# Patient Record
Sex: Female | Born: 2005 | Race: Black or African American | Hispanic: No | Marital: Single | Smoking: Never smoker
Health system: Southern US, Community
[De-identification: ages and names within clinical notes are randomized; demographics above are authoritative.]

## PROBLEM LIST (undated history)

## (undated) DIAGNOSIS — D509 Iron deficiency anemia, unspecified: Secondary | ICD-10-CM

---
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
## 2023-07-07 ENCOUNTER — Inpatient Hospital Stay: Payer: BC Managed Care – PPO

## 2023-07-07 ENCOUNTER — Inpatient Hospital Stay: Payer: BC Managed Care – PPO | Attending: Nurse Practitioner | Admitting: Nurse Practitioner

## 2023-07-07 ENCOUNTER — Encounter: Payer: Self-pay | Admitting: Nurse Practitioner

## 2023-07-07 VITALS — BP 100/72 | HR 74 | Temp 98.1°F | Resp 17 | Ht 67.5 in | Wt 120.2 lb

## 2023-07-07 DIAGNOSIS — N920 Excessive and frequent menstruation with regular cycle: Secondary | ICD-10-CM | POA: Diagnosis not present

## 2023-07-07 DIAGNOSIS — R7402 Elevation of levels of lactic acid dehydrogenase (LDH): Secondary | ICD-10-CM | POA: Insufficient documentation

## 2023-07-07 DIAGNOSIS — D649 Anemia, unspecified: Secondary | ICD-10-CM | POA: Diagnosis present

## 2023-07-07 DIAGNOSIS — R0602 Shortness of breath: Secondary | ICD-10-CM | POA: Diagnosis not present

## 2023-07-07 DIAGNOSIS — R5383 Other fatigue: Secondary | ICD-10-CM | POA: Diagnosis not present

## 2023-07-07 DIAGNOSIS — D500 Iron deficiency anemia secondary to blood loss (chronic): Secondary | ICD-10-CM

## 2023-07-07 LAB — CBC WITH DIFFERENTIAL/PLATELET
Abs Immature Granulocytes: 0 10*3/uL (ref 0.00–0.07)
Basophils Absolute: 0 10*3/uL (ref 0.0–0.1)
Basophils Relative: 0 %
Eosinophils Absolute: 0 10*3/uL (ref 0.0–0.5)
Eosinophils Relative: 1 %
HCT: 29.8 % — ABNORMAL LOW (ref 36.0–46.0)
Hemoglobin: 9 g/dL — ABNORMAL LOW (ref 12.0–15.0)
Immature Granulocytes: 0 %
Lymphocytes Relative: 35 %
Lymphs Abs: 1.1 10*3/uL (ref 0.7–4.0)
MCH: 20.6 pg — ABNORMAL LOW (ref 26.0–34.0)
MCHC: 30.2 g/dL (ref 30.0–36.0)
MCV: 68.2 fL — ABNORMAL LOW (ref 80.0–100.0)
Monocytes Absolute: 0.3 10*3/uL (ref 0.1–1.0)
Monocytes Relative: 11 %
Neutro Abs: 1.7 10*3/uL (ref 1.7–7.7)
Neutrophils Relative %: 53 %
Platelets: 273 10*3/uL (ref 150–400)
RBC: 4.37 MIL/uL (ref 3.87–5.11)
RDW: 17.6 % — ABNORMAL HIGH (ref 11.5–15.5)
Smear Review: NORMAL
WBC: 3.1 10*3/uL — ABNORMAL LOW (ref 4.0–10.5)
nRBC: 0 % (ref 0.0–0.2)

## 2023-07-07 LAB — RETIC PANEL
Immature Retic Fract: 24.6 % — ABNORMAL HIGH (ref 2.3–15.9)
RBC.: 4.39 MIL/uL (ref 3.87–5.11)
Retic Count, Absolute: 75.5 10*3/uL (ref 19.0–186.0)
Retic Ct Pct: 1.7 % (ref 0.4–3.1)
Reticulocyte Hemoglobin: 21.3 pg — ABNORMAL LOW (ref >27.9)

## 2023-07-07 LAB — IRON AND IRON BINDING CAPACITY (CC-WL,HP ONLY)
Iron: 16 ug/dL — ABNORMAL LOW (ref 28–170)
Saturation Ratios: 3 % — ABNORMAL LOW (ref 10.4–31.8)
TIBC: 536 ug/dL — ABNORMAL HIGH (ref 250–450)
UIBC: 520 ug/dL — ABNORMAL HIGH (ref 148–442)

## 2023-07-07 LAB — FOLATE: Folate: 10.8 ng/mL (ref >5.9)

## 2023-07-07 LAB — COMPREHENSIVE METABOLIC PANEL
ALT: 10 U/L (ref 0–44)
AST: 20 U/L (ref 15–41)
Albumin: 4.8 g/dL (ref 3.5–5.0)
Alkaline Phosphatase: 34 U/L — ABNORMAL LOW (ref 38–126)
Anion gap: 6 (ref 5–15)
BUN: 11 mg/dL (ref 6–20)
CO2: 24 mmol/L (ref 22–32)
Calcium: 9.5 mg/dL (ref 8.9–10.3)
Chloride: 106 mmol/L (ref 98–111)
Creatinine, Ser: 0.71 mg/dL (ref 0.44–1.00)
GFR, Estimated: >60 mL/min (ref >60)
Glucose, Bld: 90 mg/dL (ref 70–99)
Potassium: 4.5 mmol/L (ref 3.5–5.1)
Sodium: 136 mmol/L (ref 135–145)
Total Bilirubin: 1.7 mg/dL — ABNORMAL HIGH (ref 0.3–1.2)
Total Protein: 7.8 g/dL (ref 6.5–8.1)

## 2023-07-07 LAB — SAMPLE TO BLOOD BANK

## 2023-07-07 LAB — LACTATE DEHYDROGENASE: LDH: 241 U/L — ABNORMAL HIGH (ref 98–192)

## 2023-07-07 LAB — PREGNANCY, URINE: Preg Test, Ur: NEGATIVE

## 2023-07-07 LAB — TSH: TSH: 0.943 u[IU]/mL (ref 0.350–4.500)

## 2023-07-07 LAB — FERRITIN: Ferritin: 3 ng/mL — ABNORMAL LOW (ref 11–307)

## 2023-07-07 LAB — VITAMIN B12: Vitamin B-12: 349 pg/mL (ref 180–914)

## 2023-07-07 NOTE — Progress Notes (Addendum)
[facility] A Department of the [place]. [facility] [place], [place]  Clinic Day:  07/07/2023  Referring physician: Jaymes Graff, MD, [facility]  Chief Complaints: Anemia  History of Presenting Illness: Kirsten Clayton 17 y.o. female is here because of anemia. She has had history of anemia since approximately age 56. I reviewed labs available and first note anemia in April 2022 with hemoglobin 9.1 with associated microcytosis. She says it has been persistent since onset. She reports fatigue, shortness of breath with exertion and syncopal and pre-syncopal events due to her anemia. She was previously evaluated by [facility] (09/2022) for anemia (hemoglobin 6.3), Ferritin 4, Iron sat 4%, TIBC 543. She received blood transfusion. Workup including DIC, UA, TSH, Retic, LDH, Haptoglobin, B12, PT/INR, MMA, Folate were either normal or unremarkable. CRP and ESR were elevated.   Most recently, she experienced 2 syncopal episodes in 2 days after outdoor band practice. She's been increasing her fluid and food intake but symptoms persist. Started menses around age 66 which is approximately when she was first told she was anemic. She has been taking oral iron but continues to feel poorly. She complains of chest pressure on exertion, shortness of breath with exertion, pre-syncopal and syncopal episodes, pica (ice), leg cramps, brain fog and generalized fatigue. She denies NSAID ingestion and is not on antiplatelet medications. She denies black or bloody stools. She is seeing gynecology for management of her heavy periods and has started depo but continues to have daily bleeding and spotting. Overall periods have lightened though. Denies history of kidney disease, autoimmune disease, hemolytic anemia. Her brother is positive for sickle cell but she had testing and birth and was reportedly negative. She does not have a prior history or diagnosis of cancer and she is  reportedly otherwise healthy. She does not donate blood. Denies hematuria. Denies hemoptysis.   Review of Systems  Constitutional:  Positive for fatigue. Negative for appetite change and unexpected weight change.  HENT:   Negative for mouth sores, sore throat and trouble swallowing.   Respiratory:  Positive for shortness of breath. Negative for chest tightness and hemoptysis.   Cardiovascular:  Negative for chest pain and leg swelling.  Gastrointestinal:  Negative for abdominal pain, blood in stool, constipation, diarrhea, nausea, rectal pain and vomiting.  Genitourinary:  Positive for vaginal bleeding. Negative for bladder incontinence, dysuria, hematuria and pelvic pain.   Musculoskeletal:  Negative for flank pain and neck stiffness.  Skin:  Negative for itching, rash and wound.  Neurological:  Positive for light-headedness. Negative for dizziness, headaches and numbness.  Hematological:  Negative for adenopathy. Does not bruise/bleed easily.  Psychiatric/Behavioral:  Negative for confusion, depression and sleep disturbance. The patient is not nervous/anxious.      Past Medical History:  Diagnosis Date   Iron deficiency anemia    No past surgical history on file.  Family History  Problem Relation Age of Onset   Healthy Mother    Healthy Father    Sickle cell anemia Brother    Clotting disorder Maternal Grandfather     reports that she has never smoked. She has been exposed to tobacco smoke. She has never used smokeless tobacco. She reports that she does not drink alcohol and does not use drugs. She is currently in school at [employer] and plans to transfer to [employer] next year. 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   No Known Allergies  Current Outpatient Medications  Medication Sig Dispense Refill   ferrous sulfate 325 (65 FE) MG tablet Take 325 mg by mouth daily with breakfast.     ondansetron (ZOFRAN ODT) 4 MG disintegrating tablet Take 1 tablet (4 mg total) by  mouth every 8 (eight) hours as needed for nausea or vomiting. 20 tablet 0   oseltamivir (TAMIFLU) 75 MG capsule Take 1 capsule (75 mg total) by mouth every 12 (twelve) hours. 10 capsule 0   No current facility-administered medications for this visit.   Objective:  Blood pressure 100/72, pulse 74, temperature 98.1 F (36.7 C), temperature source Oral, resp. rate 17, height 5' 7.5" (1.715 m), weight 120 lb 3.2 oz (54.5 kg), SpO2 100%.  Wt Readings from Last 3 Encounters:  02/01/22 120 lb (54.4 kg) (45%, Z= -0.13)*  09/28/21 121 lb 9.6 oz (55.2 kg) (50%, Z= 0.00)*  08/09/21 118 lb 9.6 oz (53.8 kg) (44%, Z= -0.14)*   * Growth percentiles are based on CDC (Girls, 2-20 Years) data.    There is no height or weight on file to calculate BMI.  Performance status (ECOG): 1 - Symptomatic but completely ambulatory Physical Exam Vitals reviewed.  Constitutional:      Appearance: She is not ill-appearing.  Cardiovascular:     Rate and Rhythm: Normal rate and regular rhythm.  Pulmonary:     Effort: No respiratory distress.  Abdominal:     General: There is no distension.     Palpations: Abdomen is soft.     Tenderness: There is no abdominal tenderness. There is no guarding.  Neurological:     Mental Status: She is alert and oriented to person, place, and time.  Psychiatric:        Mood and Affect: Mood normal.        Behavior: Behavior normal.       Latest Ref Rng & Units 07/07/2023   10:14 AM 09/30/2022   11:50 AM 02/01/2022    7:27 PM  CBC  WBC 4.0 - 10.5 K/uL 3.1  2.8  8.0   Hemoglobin 12.0 - 15.0 g/dL 9.0  7.2  9.0   Hematocrit 36.0 - 46.0 % 29.8  25.5  30.6   Platelets 150 - 400 K/uL 273  99  629       Latest Ref Rng & Units 07/07/2023   10:14 AM 09/30/2022   11:50 AM 02/01/2022    7:27 PM  CMP  Glucose 70 - 99 mg/dL 90  87  84   BUN 6 - 20 mg/dL 11  10  11    Creatinine 0.44 - 1.00 mg/dL 6.27  0.35  0.09   Sodium 135 - 145 mmol/L 136  135  139   Potassium 3.5 - 5.1 mmol/L 4.5   3.5  3.6   Chloride 98 - 111 mmol/L 106  102  104   CO2 22 - 32 mmol/L 24  22  24    Calcium 8.9 - 10.3 mg/dL 9.5  38.1  9.9   Total Protein 6.5 - 8.1 g/dL 7.8   8.7   Total Bilirubin 0.3 - 1.2 mg/dL 1.7   1.4   Alkaline Phos 38 - 126 U/L 34   48   AST 15 - 41 U/L 20   19   ALT 0 - 44 U/L 10   10    Lab Results  Component Value Date   TIBC 536 (H) 07/07/2023   FERRITIN 3 (L) 07/07/2023   IRONPCTSAT 3 (L) 07/07/2023   Lab Results  Component Value Date   LDH 241 (H) 07/07/2023   No results found.   Assessment & Plan:  AZAYLIA SKENANDORE is a 17 y.o. female who presents in consultation of   Anemia- Anemia: today we reviewed possible etiologies of anemia including possibly multifactorial with possible causes including chronic blood loss, hyper/hypothyroidism, nutritional deficiency, infection/chronic inflammation, hemolysis, underlying bone marrow disorders. High suspicion for iron deficiency based on her history and menorrhagia. Will plan for labs today:  CBC w differential, CMP, vitamin B12, Folate, iron/TIBC, ferritin, reticulocytes, fecal occult, blood smear, TSH,  LDH, haptoglobin. Ferritin 3, iron sat 3% consistent with iron deficiency. Hemoglobin decreased at 9 but no role for transfusion. Suspect etiology of her iron deficiency is menorrhagia. Discussed iron deficiency and options for management including oral vs iv iron. She has not had durable response with oral iron, therefore, I recommend IV iron infusions. Recommend venofer 200 mg IV x 5 which she can take 1-2 times a week. I provided patient with note for school to modify her activities due to her symptoms.  Menorrhagia- patient is under the care of gyn. REviewed that she will likely need ongoing iron replacement due to heavy periods.   Disposition:  Labs today Venofer 200 mg IV x 5  1 month lab (cbc, cmp, ldh, ferritin, iron studies) Virtual follow up with me a few days later- la  Patient would like to transition care to  [employer]  I discussed the assessment and treatment plan with the patient.  The patient was provided an opportunity to ask questions and all were answered.  The patient agreed with the plan and demonstrated an understanding of the instructions.  The patient was advised to call back if the symptoms worsen or if the condition fails to improve as anticipated.  Consuello Masse, DNP, AGNP-C [facility] A Department of the [place]. [facility] [phone] (clinic)  Dr. Normand Sloop, please see my above note from today's visit. Thank you for the opportunity to participate in the care of your very pleasant patient. If you have any questions or concerns, please feel free to reach out.   CC: Dr. Normand Sloop

## 2023-07-09 ENCOUNTER — Encounter: Payer: Self-pay | Admitting: Nurse Practitioner

## 2023-07-09 LAB — HAPTOGLOBIN: Haptoglobin: 21 mg/dL — ABNORMAL LOW (ref 33–278)

## 2023-07-09 NOTE — Addendum Note (Signed)
Addended by: Alinda Dooms on: 07/09/2023 02:05 PM   Modules accepted: Orders

## 2023-07-11 LAB — HGB FRACTIONATION CASCADE
Hgb A2: 2 % (ref 1.8–3.2)
Hgb A: 98 % (ref 96.4–98.8)
Hgb F: 0 % (ref 0.0–2.0)
Hgb S: 0 %

## 2023-07-12 ENCOUNTER — Other Ambulatory Visit: Payer: Self-pay | Admitting: Nurse Practitioner

## 2023-07-12 ENCOUNTER — Ambulatory Visit (INDEPENDENT_AMBULATORY_CARE_PROVIDER_SITE_OTHER): Payer: BC Managed Care – PPO

## 2023-07-12 VITALS — BP 102/67 | HR 86 | Temp 98.9°F | Resp 16 | Ht 67.0 in | Wt 119.8 lb

## 2023-07-12 DIAGNOSIS — D649 Anemia, unspecified: Secondary | ICD-10-CM

## 2023-07-12 MED ORDER — DIPHENHYDRAMINE HCL 25 MG PO CAPS
25.0000 mg | ORAL_CAPSULE | Freq: Once | ORAL | Status: AC
  Administered 2023-07-12: 25 mg via ORAL
  Filled 2023-07-12: qty 1

## 2023-07-12 MED ORDER — ACETAMINOPHEN 325 MG PO TABS
650.0000 mg | ORAL_TABLET | Freq: Once | ORAL | Status: AC
  Administered 2023-07-12: 650 mg via ORAL
  Filled 2023-07-12: qty 2

## 2023-07-12 MED ORDER — SODIUM CHLORIDE 0.9 % IV SOLN
200.0000 mg | Freq: Once | INTRAVENOUS | Status: AC
  Administered 2023-07-12: 200 mg via INTRAVENOUS
  Filled 2023-07-12: qty 10

## 2023-07-12 NOTE — Patient Instructions (Signed)
 Iron Sucrose Injection What is this medication? IRON SUCROSE (EYE ern SOO krose) treats low levels of iron (iron deficiency anemia) in people with kidney disease. Iron is a mineral that plays an important role in making red blood cells, which carry oxygen from your lungs to the rest of your body. This medicine may be used for other purposes; ask your health care provider or pharmacist if you have questions. COMMON BRAND NAME(S): Venofer What should I tell my care team before I take this medication? They need to know if you have any of these conditions: Anemia not caused by low iron levels Heart disease High levels of iron in the blood Kidney disease Liver disease An unusual or allergic reaction to iron, other medications, foods, dyes, or preservatives Pregnant or trying to get pregnant Breastfeeding How should I use this medication? This medication is for infusion into a vein. It is given in a hospital or clinic setting. Talk to your care team about the use of this medication in children. While this medication may be prescribed for children as young as 2 years for selected conditions, precautions do apply. Overdosage: If you think you have taken too much of this medicine contact a poison control center or emergency room at once. NOTE: This medicine is only for you. Do not share this medicine with others. What if I miss a dose? Keep appointments for follow-up doses. It is important not to miss your dose. Call your care team if you are unable to keep an appointment. What may interact with this medication? Do not take this medication with any of the following: Deferoxamine Dimercaprol Other iron products This medication may also interact with the following: Chloramphenicol Deferasirox This list may not describe all possible interactions. Give your health care provider a list of all the medicines, herbs, non-prescription drugs, or dietary supplements you use. Also tell them if you smoke,  drink alcohol, or use illegal drugs. Some items may interact with your medicine. What should I watch for while using this medication? Visit your care team regularly. Tell your care team if your symptoms do not start to get better or if they get worse. You may need blood work done while you are taking this medication. You may need to follow a special diet. Talk to your care team. Foods that contain iron include: whole grains/cereals, dried fruits, beans, or peas, leafy green vegetables, and organ meats (liver, kidney). What side effects may I notice from receiving this medication? Side effects that you should report to your care team as soon as possible: Allergic reactions--skin rash, itching, hives, swelling of the face, lips, tongue, or throat Low blood pressure--dizziness, feeling faint or lightheaded, blurry vision Shortness of breath Side effects that usually do not require medical attention (report to your care team if they continue or are bothersome): Flushing Headache Joint pain Muscle pain Nausea Pain, redness, or irritation at injection site This list may not describe all possible side effects. Call your doctor for medical advice about side effects. You may report side effects to FDA at 1-800-FDA-1088. Where should I keep my medication? This medication is given in a hospital or clinic. It will not be stored at home. NOTE: This sheet is a summary. It may not cover all possible information. If you have questions about this medicine, talk to your doctor, pharmacist, or health care provider.  2024 Elsevier/Gold Standard (2023-04-20 00:00:00)

## 2023-07-12 NOTE — Progress Notes (Signed)
Diagnosis: Iron Deficiency Anemia  Provider:  Chilton Greathouse MD  Procedure: IV Infusion  IV Type: Peripheral, IV Location: R Hand  Venofer (Iron Sucrose), Dose: 200 mg  Infusion Start Time: 1337  Infusion Stop Time: 1354  Post Infusion IV Care: Observation period completed and Peripheral IV Discontinued  Discharge: Condition: Good, Destination: Home . AVS Provided  Performed by:  Garnette Czech, RN

## 2023-07-17 ENCOUNTER — Ambulatory Visit (INDEPENDENT_AMBULATORY_CARE_PROVIDER_SITE_OTHER): Payer: BC Managed Care – PPO

## 2023-07-17 VITALS — BP 105/70 | HR 60 | Temp 98.5°F | Resp 18 | Ht 67.0 in | Wt 119.0 lb

## 2023-07-17 DIAGNOSIS — D649 Anemia, unspecified: Secondary | ICD-10-CM | POA: Diagnosis not present

## 2023-07-17 MED ORDER — DIPHENHYDRAMINE HCL 25 MG PO CAPS
25.0000 mg | ORAL_CAPSULE | Freq: Once | ORAL | Status: AC
  Administered 2023-07-17: 25 mg via ORAL
  Filled 2023-07-17: qty 1

## 2023-07-17 MED ORDER — ACETAMINOPHEN 325 MG PO TABS
650.0000 mg | ORAL_TABLET | Freq: Once | ORAL | Status: AC
  Administered 2023-07-17: 650 mg via ORAL
  Filled 2023-07-17: qty 2

## 2023-07-17 MED ORDER — SODIUM CHLORIDE 0.9 % IV SOLN
200.0000 mg | Freq: Once | INTRAVENOUS | Status: AC
  Administered 2023-07-17: 200 mg via INTRAVENOUS
  Filled 2023-07-17: qty 10

## 2023-07-17 NOTE — Progress Notes (Signed)
Diagnosis: Iron Deficiency Anemia  Provider:  Chilton Greathouse MD  Procedure: IV Infusion  IV Type: Peripheral, IV Location: R Hand  Venofer (Iron Sucrose), Dose: 200 mg  Infusion Start Time: 1410  pm  Infusion Stop Time: 1431 pm  Post Infusion IV Care: Observation period completed and Subcutaneous infusion needle discontinued  Discharge: Condition: Good, Destination: Home . AVS Declined  Performed by:  Adriana Mccallum, RN

## 2023-07-20 ENCOUNTER — Ambulatory Visit (INDEPENDENT_AMBULATORY_CARE_PROVIDER_SITE_OTHER): Payer: BC Managed Care – PPO

## 2023-07-20 VITALS — BP 95/58 | HR 78 | Temp 98.7°F | Resp 16 | Ht 67.0 in | Wt 123.6 lb

## 2023-07-20 DIAGNOSIS — D649 Anemia, unspecified: Secondary | ICD-10-CM

## 2023-07-20 MED ORDER — ACETAMINOPHEN 325 MG PO TABS
650.0000 mg | ORAL_TABLET | Freq: Once | ORAL | Status: AC
  Administered 2023-07-20: 650 mg via ORAL
  Filled 2023-07-20: qty 2

## 2023-07-20 MED ORDER — SODIUM CHLORIDE 0.9 % IV SOLN
200.0000 mg | Freq: Once | INTRAVENOUS | Status: AC
  Administered 2023-07-20: 200 mg via INTRAVENOUS
  Filled 2023-07-20: qty 10

## 2023-07-20 MED ORDER — DIPHENHYDRAMINE HCL 25 MG PO CAPS
25.0000 mg | ORAL_CAPSULE | Freq: Once | ORAL | Status: AC
  Administered 2023-07-20: 25 mg via ORAL
  Filled 2023-07-20: qty 1

## 2023-07-20 NOTE — Progress Notes (Signed)
Diagnosis: Iron Deficiency Anemia  Provider:  Chilton Greathouse MD  Procedure: IV Infusion  IV Type: Peripheral, IV Location: R Antecubital  Venofer (Iron Sucrose), Dose: 200 mg  Infusion Start Time: 1419  Infusion Stop Time: 1439  Post Infusion IV Care: Patient declined observation and Peripheral IV Discontinued  Discharge: Condition: Good, Destination: Home . AVS Declined  Performed by:  Loney Hering, LPN

## 2023-07-23 ENCOUNTER — Ambulatory Visit (INDEPENDENT_AMBULATORY_CARE_PROVIDER_SITE_OTHER): Payer: BC Managed Care – PPO

## 2023-07-23 VITALS — BP 95/60 | HR 63 | Temp 98.3°F | Resp 16 | Ht 67.0 in | Wt 122.2 lb

## 2023-07-23 DIAGNOSIS — D649 Anemia, unspecified: Secondary | ICD-10-CM

## 2023-07-23 MED ORDER — SODIUM CHLORIDE 0.9 % IV SOLN
200.0000 mg | Freq: Once | INTRAVENOUS | Status: AC
  Administered 2023-07-23: 200 mg via INTRAVENOUS
  Filled 2023-07-23: qty 10

## 2023-07-23 MED ORDER — ACETAMINOPHEN 325 MG PO TABS
650.0000 mg | ORAL_TABLET | Freq: Once | ORAL | Status: AC
  Administered 2023-07-23: 650 mg via ORAL
  Filled 2023-07-23: qty 2

## 2023-07-23 MED ORDER — DIPHENHYDRAMINE HCL 25 MG PO CAPS
25.0000 mg | ORAL_CAPSULE | Freq: Once | ORAL | Status: AC
  Administered 2023-07-23: 25 mg via ORAL
  Filled 2023-07-23: qty 1

## 2023-07-23 NOTE — Progress Notes (Signed)
Diagnosis: Iron Deficiency Anemia  Provider:  Chilton Greathouse MD  Procedure: IV Infusion  IV Type: Peripheral, IV Location: R Antecubital  Venofer (Iron Sucrose), Dose: 200 mg  Infusion Start Time: 1145  Infusion Stop Time: 1202  Post Infusion IV Care: Patient declined observation and Peripheral IV Discontinued  Discharge: Condition: Good, Destination: Home . AVS Declined  Performed by:  Garnette Czech, RN

## 2023-07-25 ENCOUNTER — Ambulatory Visit (INDEPENDENT_AMBULATORY_CARE_PROVIDER_SITE_OTHER): Payer: BC Managed Care – PPO

## 2023-07-25 VITALS — BP 99/60 | HR 77 | Temp 98.1°F | Resp 14 | Ht 67.0 in | Wt 123.4 lb

## 2023-07-25 DIAGNOSIS — D649 Anemia, unspecified: Secondary | ICD-10-CM

## 2023-07-25 MED ORDER — ACETAMINOPHEN 325 MG PO TABS
650.0000 mg | ORAL_TABLET | Freq: Once | ORAL | Status: AC
  Administered 2023-07-25: 650 mg via ORAL
  Filled 2023-07-25: qty 2

## 2023-07-25 MED ORDER — SODIUM CHLORIDE 0.9 % IV SOLN
200.0000 mg | Freq: Once | INTRAVENOUS | Status: AC
  Administered 2023-07-25: 200 mg via INTRAVENOUS
  Filled 2023-07-25: qty 10

## 2023-07-25 MED ORDER — DIPHENHYDRAMINE HCL 25 MG PO CAPS
25.0000 mg | ORAL_CAPSULE | Freq: Once | ORAL | Status: AC
  Administered 2023-07-25: 25 mg via ORAL
  Filled 2023-07-25: qty 1

## 2023-07-25 NOTE — Progress Notes (Signed)
Diagnosis: Acute Anemia  Provider:  Chilton Greathouse MD  Procedure: IV Infusion  IV Type: Peripheral, IV Location: R Antecubital  Venofer (Iron Sucrose), Dose: 200 mg  Infusion Start Time: 1129  Infusion Stop Time: 1144  Post Infusion IV Care: Peripheral IV Discontinued  Discharge: Condition: Good, Destination: Home . AVS Declined  Performed by:  Wyvonne Lenz, RN

## 2023-08-13 ENCOUNTER — Inpatient Hospital Stay: Payer: BC Managed Care – PPO | Attending: Nurse Practitioner

## 2023-08-13 ENCOUNTER — Inpatient Hospital Stay: Payer: BC Managed Care – PPO

## 2023-08-13 DIAGNOSIS — R5383 Other fatigue: Secondary | ICD-10-CM | POA: Diagnosis not present

## 2023-08-13 DIAGNOSIS — D500 Iron deficiency anemia secondary to blood loss (chronic): Secondary | ICD-10-CM

## 2023-08-13 DIAGNOSIS — N920 Excessive and frequent menstruation with regular cycle: Secondary | ICD-10-CM | POA: Diagnosis not present

## 2023-08-13 DIAGNOSIS — R7402 Elevation of levels of lactic acid dehydrogenase (LDH): Secondary | ICD-10-CM | POA: Diagnosis not present

## 2023-08-13 DIAGNOSIS — R0602 Shortness of breath: Secondary | ICD-10-CM | POA: Insufficient documentation

## 2023-08-13 DIAGNOSIS — D649 Anemia, unspecified: Secondary | ICD-10-CM

## 2023-08-13 DIAGNOSIS — D509 Iron deficiency anemia, unspecified: Secondary | ICD-10-CM | POA: Insufficient documentation

## 2023-08-13 LAB — COMPREHENSIVE METABOLIC PANEL
ALT: 11 U/L (ref 0–44)
AST: 21 U/L (ref 15–41)
Albumin: 4.7 g/dL (ref 3.5–5.0)
Alkaline Phosphatase: 39 U/L (ref 38–126)
Anion gap: 6 (ref 5–15)
BUN: 9 mg/dL (ref 6–20)
CO2: 25 mmol/L (ref 22–32)
Calcium: 9.7 mg/dL (ref 8.9–10.3)
Chloride: 107 mmol/L (ref 98–111)
Creatinine, Ser: 0.67 mg/dL (ref 0.44–1.00)
GFR, Estimated: >60 mL/min (ref >60)
Glucose, Bld: 90 mg/dL (ref 70–99)
Potassium: 4 mmol/L (ref 3.5–5.1)
Sodium: 138 mmol/L (ref 135–145)
Total Bilirubin: 1.9 mg/dL — ABNORMAL HIGH (ref 0.3–1.2)
Total Protein: 7.8 g/dL (ref 6.5–8.1)

## 2023-08-13 LAB — LACTATE DEHYDROGENASE: LDH: 149 U/L (ref 98–192)

## 2023-08-13 LAB — CBC WITH DIFFERENTIAL/PLATELET
Abs Immature Granulocytes: 0 10*3/uL (ref 0.00–0.07)
Basophils Absolute: 0 10*3/uL (ref 0.0–0.1)
Basophils Relative: 0 %
Eosinophils Absolute: 0.1 10*3/uL (ref 0.0–0.5)
Eosinophils Relative: 2 %
HCT: 35.8 % — ABNORMAL LOW (ref 36.0–46.0)
Hemoglobin: 11.9 g/dL — ABNORMAL LOW (ref 12.0–15.0)
Immature Granulocytes: 0 %
Lymphocytes Relative: 35 %
Lymphs Abs: 1.3 10*3/uL (ref 0.7–4.0)
MCH: 25.4 pg — ABNORMAL LOW (ref 26.0–34.0)
MCHC: 33.2 g/dL (ref 30.0–36.0)
MCV: 76.5 fL — ABNORMAL LOW (ref 80.0–100.0)
Monocytes Absolute: 0.4 10*3/uL (ref 0.1–1.0)
Monocytes Relative: 10 %
Neutro Abs: 2 10*3/uL (ref 1.7–7.7)
Neutrophils Relative %: 53 %
Platelets: 254 10*3/uL (ref 150–400)
RBC: 4.68 MIL/uL (ref 3.87–5.11)
RDW: 21.6 % — ABNORMAL HIGH (ref 11.5–15.5)
WBC: 3.7 10*3/uL — ABNORMAL LOW (ref 4.0–10.5)
nRBC: 0 % (ref 0.0–0.2)

## 2023-08-13 LAB — IRON AND IRON BINDING CAPACITY (CC-WL,HP ONLY)
Iron: 91 ug/dL (ref 28–170)
Saturation Ratios: 22 % (ref 10.4–31.8)
TIBC: 406 ug/dL (ref 250–450)
UIBC: 315 ug/dL (ref 148–442)

## 2023-08-14 LAB — FERRITIN: Ferritin: 63 ng/mL (ref 11–307)

## 2023-08-16 ENCOUNTER — Inpatient Hospital Stay (HOSPITAL_BASED_OUTPATIENT_CLINIC_OR_DEPARTMENT_OTHER): Payer: BC Managed Care – PPO | Admitting: Nurse Practitioner

## 2023-08-16 DIAGNOSIS — D500 Iron deficiency anemia secondary to blood loss (chronic): Secondary | ICD-10-CM

## 2023-08-16 NOTE — Progress Notes (Signed)
Patient did not answer for video visit. Will have my office contact her to reschedule.

## 2023-08-17 ENCOUNTER — Encounter: Payer: Self-pay | Admitting: Nurse Practitioner

## 2023-08-17 ENCOUNTER — Inpatient Hospital Stay (HOSPITAL_BASED_OUTPATIENT_CLINIC_OR_DEPARTMENT_OTHER): Payer: BC Managed Care – PPO | Admitting: Nurse Practitioner

## 2023-08-17 DIAGNOSIS — N921 Excessive and frequent menstruation with irregular cycle: Secondary | ICD-10-CM

## 2023-08-17 DIAGNOSIS — D500 Iron deficiency anemia secondary to blood loss (chronic): Secondary | ICD-10-CM

## 2023-08-17 NOTE — Progress Notes (Signed)
Virtual Visit Progress Note  Symptom Management Clinic  [facility] at [facility] A Department of the [place]. [facility] [address] [place], [place] [address] [phone] (phone) [phone] (fax)  I connected with Kirsten Clayton on 08/17/23 at  2:00 PM EDT by video enabled telemedicine visit and verified that I am speaking with the correct person using two identifiers.   I discussed the limitations, risks, security and privacy concerns of performing an evaluation and management service by telemedicine and the availability of in-person appointments. I also discussed with the patient that there may be a patient responsible charge related to this service. The patient expressed understanding and agreed to proceed.   Other persons participating in the visit and their role in the encounter: none   Patient's location: home  Provider's location: clinic   Chief Complaint: Iron Deficiency Anemia    Patient Care Team: Patient, No Pcp Per as PCP - General (General Practice) Alinda Dooms, NP as Nurse Practitioner (Nurse Practitioner)   Name of the patient: Kirsten Clayton  [id number]  27-Jun-2006   Date of visit: 08/17/23  Chief complaint/ Reason for visit- Iron Deficiency Anemia  Hematology history: Kirsten Clayton 17 y.o. female is here because of anemia. She has had history of anemia since approximately age 55. I reviewed labs available and first note anemia in April 2022 with hemoglobin 9.1 with associated microcytosis. She says it has been persistent since onset. She reports fatigue, shortness of breath with exertion and syncopal and pre-syncopal events due to her anemia. She was previously evaluated by [facility] (09/2022) for anemia (hemoglobin 6.3), Ferritin 4, Iron sat 4%, TIBC 543. She received blood transfusion. Workup including DIC, UA, TSH, Retic, LDH, Haptoglobin, B12, PT/INR, MMA, Folate were either normal or unremarkable.  CRP and ESR were elevated.    Most recently, she experienced 2 syncopal episodes in 2 days after outdoor band practice. She's been increasing her fluid and food intake but symptoms persist. Started menses around age 27 which is approximately when she was first told she was anemic. She has been taking oral iron but continues to feel poorly. She complains of chest pressure on exertion, shortness of breath with exertion, pre-syncopal and syncopal episodes, pica (ice), leg cramps, brain fog and generalized fatigue. She denies NSAID ingestion and is not on antiplatelet medications. She denies black or bloody stools. She is seeing gynecology for management of her heavy periods and has started depo but continues to have daily bleeding and spotting.  August 2024- Hmg 9.0. Microcytic. Ferritin- 3, Iron sat 3%, TIBC 536. LDH- 241. Negative for thalassemia or hemoglobin variant. TSH, B12, Folate were normal. Schistocytes on blood smear.   Interval history- Patient is 17 year old female with above history of iron deficiency anemia who agrees to evaluation via telemedicine for follow up. She has received iron infusions at [address] location. Tolerated well and denies side effects. She is feeling better and her energy level has improved. No additional syncopal episodes. She is able to practice with marching band and stamina has improved. Continues to have menstrual bleeding 25 of 30 days a month. She is due for repeat depo injection next week and has follow up with OB scheduled. Denies any neurologic complaints. Denies recent fevers or illnesses. Denies any easy bleeding or bruising. No melena or hematochezia. No pica or restless leg. Reports good appetite and denies weight loss. Denies chest pain. Denies any nausea, vomiting, constipation, or diarrhea.  Denies urinary complaints. Patient offers no further specific complaints today.  ECOG FS:1 - Symptomatic but completely ambulatory  Review of systems- Review of  Systems  Constitutional:  Positive for malaise/fatigue. Negative for chills, diaphoresis, fever and weight loss.  Respiratory:  Negative for cough, hemoptysis, sputum production, shortness of breath and wheezing.   Cardiovascular:  Negative for chest pain, palpitations and leg swelling.  Gastrointestinal:  Negative for abdominal pain, blood in stool, constipation, diarrhea, melena, nausea and vomiting.  Genitourinary:  Negative for hematuria.  Musculoskeletal:  Negative for falls, joint pain and myalgias.  Skin:  Negative for rash.  Neurological:  Negative for dizziness, loss of consciousness, weakness and headaches.  Psychiatric/Behavioral:  Negative for depression. The patient is not nervous/anxious and does not have insomnia.   All other systems reviewed and are negative.   No Known Allergies  Past Medical History:  Diagnosis Date   Iron deficiency anemia    No past surgical history on file.  Social History   Socioeconomic History   Marital status: Single    Spouse name: Not on file   Number of children: Not on file   Years of education: Not on file   Highest education level: Not on file  Occupational History   Not on file  Tobacco Use   Smoking status: Never    Passive exposure: Yes   Smokeless tobacco: Never  Vaping Use   Vaping status: Never Used  Substance and Sexual Activity   Alcohol use: No   Drug use: No   Sexual activity: Not on file  Other Topics Concern   Not on file  Social History Narrative   Not on file   Social Determinants of Health   Financial Resource Strain: Not on file  Food Insecurity: No Food Insecurity (07/07/2023)   Hunger Vital Sign    Worried About Running Out of Food in the Last Year: Never true    Ran Out of Food in the Last Year: Never true  Transportation Needs: No Transportation Needs (07/07/2023)   PRAPARE - Administrator, Civil Service (Medical): No    Lack of Transportation (Non-Medical): No  Physical Activity: Not  on file  Stress: Not on file  Social Connections: Not on file  Intimate Partner Violence: Not on file    Family History  Problem Relation Age of Onset   Healthy Mother    Healthy Father    Sickle cell anemia Brother    Clotting disorder Maternal Grandfather      Current Outpatient Medications:    ferrous sulfate 325 (65 FE) MG tablet, Take 325 mg by mouth daily with breakfast., Disp: , Rfl:    VITAMIN D, CHOLECALCIFEROL, PO, Take by mouth. One tablet daily, 1250(dose), Disp: , Rfl:   Physical exam: Exam limited due to telemedicine  There were no vitals filed for this visit. Physical Exam Constitutional:      Appearance: She is not ill-appearing.  Pulmonary:     Effort: No respiratory distress.  Neurological:     Mental Status: She is alert and oriented to person, place, and time.  Psychiatric:        Mood and Affect: Mood normal.        Behavior: Behavior normal.        Latest Ref Rng & Units 08/13/2023    3:33 PM  CMP  Glucose 70 - 99 mg/dL 90   BUN 6 - 20 mg/dL 9   Creatinine 4.69 - 6.29 mg/dL 5.28  Sodium 135 - 145 mmol/L 138   Potassium 3.5 - 5.1 mmol/L 4.0   Chloride 98 - 111 mmol/L 107   CO2 22 - 32 mmol/L 25   Calcium 8.9 - 10.3 mg/dL 9.7   Total Protein 6.5 - 8.1 g/dL 7.8   Total Bilirubin 0.3 - 1.2 mg/dL 1.9   Alkaline Phos 38 - 126 U/L 39   AST 15 - 41 U/L 21   ALT 0 - 44 U/L 11       Latest Ref Rng & Units 08/13/2023    3:33 PM  CBC  WBC 4.0 - 10.5 K/uL 3.7   Hemoglobin 12.0 - 15.0 g/dL 40.9   Hematocrit 81.1 - 46.0 % 35.8   Platelets 150 - 400 K/uL 254    Iron/TIBC/Ferritin/ %Sat    Component Value Date/Time   IRON 91 08/13/2023 1535   TIBC 406 08/13/2023 1535   FERRITIN 63 08/13/2023 1533   IRONPCTSAT 22 08/13/2023 1535    No results found.  Assessment and plan- Patient is a 17 y.o. female with history of anemia who returns to clinic for follow up.    Anemia- workup consistent with iron deficiency, likely secondary to menorrhagia,  though cannot rule out some hemolysis from underlying hemoglobinopathy. She was negative for thalassemia or hemoglobin variant and alpha thalassemia results are pending. She received venofer 200 mg x 5. Toelrated well. Hemoglobin has improved to 11.9. Microcytosis improving. Ferritin now 63, iron sat 22%. Symptomatically improved. LDH has normalized. More likely that ldh and schistocytes were related to IDA given that they've normalized with improvement in her counts. Given ongoing bleeding, she will likely need continued support of her counts with IV iron. Plan to recheck counts in 1 month, see me, +/- venofer. Can also do virtual appointment with infusions at [address] if she prefers.  Menorrhagia- Ongoing vaginal bleeding despite depo administration. She has follow up with OB later this week. Recommend evaluation and possible alternatives to help control her bleeding.   Disposition:  1 mo- lab (cbc, cmp, ferritin, iron studies), see me, +/- venofer- la  Visit Diagnosis 1. Iron deficiency anemia due to chronic blood loss   2. Menorrhagia with irregular cycle    Patient expressed understanding and was in agreement with this plan. She also understands that She can call clinic at any time with any questions, concerns, or complaints.   I discussed the assessment and treatment plan with the patient. The patient was provided an opportunity to ask questions and all were answered. The patient agreed with the plan and demonstrated an understanding of the instructions.   The patient was advised to call back or seek an in-person evaluation if the symptoms worsen or if the condition fails to improve as anticipated.   I spent 20 minutes face-to-face video visit time dedicated to the care of this patient on the date of this encounter to include pre-visit review of previous notes and labs, face-to-face time with the patient, and post visit ordering of testing/documentation.   Thank you for allowing me to  participate in the care of this very pleasant patient.   Consuello Masse, DNP, AGNP-C [facility]  CC: [facility]

## 2023-08-29 LAB — ALPHA-THALASSEMIA GENOTYPR

## 2023-09-20 ENCOUNTER — Encounter: Payer: Self-pay | Admitting: Nurse Practitioner

## 2023-09-21 ENCOUNTER — Ambulatory Visit: Payer: BC Managed Care – PPO

## 2023-09-21 ENCOUNTER — Inpatient Hospital Stay: Payer: BC Managed Care – PPO | Admitting: Nurse Practitioner

## 2023-09-21 ENCOUNTER — Inpatient Hospital Stay: Payer: BC Managed Care – PPO

## 2023-09-25 ENCOUNTER — Other Ambulatory Visit: Payer: Self-pay

## 2023-09-25 ENCOUNTER — Inpatient Hospital Stay: Payer: BC Managed Care – PPO

## 2023-09-25 ENCOUNTER — Encounter: Payer: Self-pay | Admitting: Nurse Practitioner

## 2023-09-25 ENCOUNTER — Inpatient Hospital Stay: Payer: BC Managed Care – PPO | Attending: Nurse Practitioner

## 2023-09-25 ENCOUNTER — Inpatient Hospital Stay (HOSPITAL_BASED_OUTPATIENT_CLINIC_OR_DEPARTMENT_OTHER): Payer: BC Managed Care – PPO | Admitting: Nurse Practitioner

## 2023-09-25 VITALS — BP 111/76 | HR 73 | Temp 98.6°F | Resp 20 | Wt 122.2 lb

## 2023-09-25 VITALS — BP 106/69 | HR 74

## 2023-09-25 DIAGNOSIS — R55 Syncope and collapse: Secondary | ICD-10-CM | POA: Insufficient documentation

## 2023-09-25 DIAGNOSIS — D500 Iron deficiency anemia secondary to blood loss (chronic): Secondary | ICD-10-CM | POA: Diagnosis not present

## 2023-09-25 DIAGNOSIS — R5381 Other malaise: Secondary | ICD-10-CM | POA: Diagnosis not present

## 2023-09-25 DIAGNOSIS — D509 Iron deficiency anemia, unspecified: Secondary | ICD-10-CM | POA: Diagnosis present

## 2023-09-25 DIAGNOSIS — R17 Unspecified jaundice: Secondary | ICD-10-CM

## 2023-09-25 DIAGNOSIS — N920 Excessive and frequent menstruation with regular cycle: Secondary | ICD-10-CM | POA: Diagnosis not present

## 2023-09-25 DIAGNOSIS — R5383 Other fatigue: Secondary | ICD-10-CM | POA: Insufficient documentation

## 2023-09-25 DIAGNOSIS — R778 Other specified abnormalities of plasma proteins: Secondary | ICD-10-CM

## 2023-09-25 LAB — BILIRUBIN, FRACTIONATED(TOT/DIR/INDIR)
Bilirubin, Direct: 0.4 mg/dL — ABNORMAL HIGH (ref 0.0–0.2)
Indirect Bilirubin: 2.9 mg/dL — ABNORMAL HIGH (ref 0.3–0.9)
Total Bilirubin: 3.3 mg/dL — ABNORMAL HIGH (ref 0.3–1.2)

## 2023-09-25 LAB — IRON AND TIBC
Iron: 144 ug/dL (ref 28–170)
Saturation Ratios: 40 % — ABNORMAL HIGH (ref 10.4–31.8)
TIBC: 363 ug/dL (ref 250–450)
UIBC: 219 ug/dL

## 2023-09-25 LAB — CMP (CANCER CENTER ONLY)
ALT: 12 U/L (ref 0–44)
AST: 18 U/L (ref 15–41)
Albumin: 4.5 g/dL (ref 3.5–5.0)
Alkaline Phosphatase: 37 U/L — ABNORMAL LOW (ref 38–126)
Anion gap: 7 (ref 5–15)
BUN: 13 mg/dL (ref 6–20)
CO2: 23 mmol/L (ref 22–32)
Calcium: 9.4 mg/dL (ref 8.9–10.3)
Chloride: 107 mmol/L (ref 98–111)
Creatinine: 0.75 mg/dL (ref 0.44–1.00)
GFR, Estimated: >60 mL/min (ref >60)
Glucose, Bld: 108 mg/dL — ABNORMAL HIGH (ref 70–99)
Potassium: 3.6 mmol/L (ref 3.5–5.1)
Sodium: 137 mmol/L (ref 135–145)
Total Bilirubin: 2.7 mg/dL — ABNORMAL HIGH (ref 0.3–1.2)
Total Protein: 7.8 g/dL (ref 6.5–8.1)

## 2023-09-25 LAB — TECHNOLOGIST SMEAR REVIEW: Plt Morphology: ADEQUATE

## 2023-09-25 LAB — CBC WITH DIFFERENTIAL (CANCER CENTER ONLY)
Abs Immature Granulocytes: 0.01 10*3/uL (ref 0.00–0.07)
Basophils Absolute: 0 10*3/uL (ref 0.0–0.1)
Basophils Relative: 0 %
Eosinophils Absolute: 0.1 10*3/uL (ref 0.0–0.5)
Eosinophils Relative: 2 %
HCT: 35.5 % — ABNORMAL LOW (ref 36.0–46.0)
Hemoglobin: 12.5 g/dL (ref 12.0–15.0)
Immature Granulocytes: 0 %
Lymphocytes Relative: 42 %
Lymphs Abs: 1.3 10*3/uL (ref 0.7–4.0)
MCH: 26.9 pg (ref 26.0–34.0)
MCHC: 35.2 g/dL (ref 30.0–36.0)
MCV: 76.3 fL — ABNORMAL LOW (ref 80.0–100.0)
Monocytes Absolute: 0.3 10*3/uL (ref 0.1–1.0)
Monocytes Relative: 8 %
Neutro Abs: 1.5 10*3/uL — ABNORMAL LOW (ref 1.7–7.7)
Neutrophils Relative %: 48 %
Platelet Count: 257 10*3/uL (ref 150–400)
RBC: 4.65 MIL/uL (ref 3.87–5.11)
RDW: 17.2 % — ABNORMAL HIGH (ref 11.5–15.5)
WBC Count: 3.2 10*3/uL — ABNORMAL LOW (ref 4.0–10.5)
nRBC: 0 % (ref 0.0–0.2)

## 2023-09-25 LAB — DIRECT ANTIGLOBULIN TEST (NOT AT ARMC)
DAT, IgG: NEGATIVE
DAT, complement: NEGATIVE

## 2023-09-25 LAB — PREGNANCY, URINE: Preg Test, Ur: NEGATIVE

## 2023-09-25 LAB — FERRITIN: Ferritin: 42 ng/mL (ref 11–307)

## 2023-09-25 LAB — LACTATE DEHYDROGENASE: LDH: 150 U/L (ref 98–192)

## 2023-09-25 MED ORDER — IRON SUCROSE 20 MG/ML IV SOLN
200.0000 mg | Freq: Once | INTRAVENOUS | Status: AC
  Administered 2023-09-25: 200 mg via INTRAVENOUS
  Filled 2023-09-25: qty 10

## 2023-09-25 MED ORDER — SODIUM CHLORIDE 0.9% FLUSH
10.0000 mL | INTRAVENOUS | Status: DC | PRN
  Administered 2023-09-25: 10 mL via INTRAVENOUS
  Filled 2023-09-25: qty 10

## 2023-09-25 MED ORDER — DIPHENHYDRAMINE HCL 25 MG PO CAPS
25.0000 mg | ORAL_CAPSULE | Freq: Once | ORAL | Status: AC | PRN
  Administered 2023-09-25: 25 mg via ORAL

## 2023-09-25 NOTE — Progress Notes (Signed)
Patient declined to wait the 30 minutes for post iron infusion observation today. Tolerated infusion well. VSS. 

## 2023-09-25 NOTE — Progress Notes (Unsigned)
[facility] at [facility] A Department of the [place]. [facility] [address] [place], [place] [address] [phone] (phone) [phone] (fax)  Clinic Day:  09/25/2023  Referring physician: Jaymes Graff, MD, [facility]  Chief Complaints: Anemia  History of Presenting Illness: Kirsten Clayton 17 y.o. female is here because of anemia. She has had history of anemia since approximately age 68. I reviewed labs available and first note anemia in April 2022 with hemoglobin 9.1 with associated microcytosis. She says it has been persistent since onset. She reports fatigue, shortness of breath with exertion and syncopal and pre-syncopal events due to her anemia. She was previously evaluated by [facility] (09/2022) for anemia (hemoglobin 6.3), Ferritin 4, Iron sat 4%, TIBC 543. She received blood transfusion. Workup including DIC, UA, TSH, Retic, LDH, Haptoglobin, B12, PT/INR, MMA, Folate were either normal or unremarkable. CRP and ESR were elevated.    Most recently, she experienced 2 syncopal episodes in 2 days after outdoor band practice. She's been increasing her fluid and food intake but symptoms persist. Started menses around age 43 which is approximately when she was first told she was anemic. She has been taking oral iron but continues to feel poorly. She complains of chest pressure on exertion, shortness of breath with exertion, pre-syncopal and syncopal episodes, pica (ice), leg cramps, brain fog and generalized fatigue. She denies NSAID ingestion and is not on antiplatelet medications. She denies black or bloody stools. She is seeing gynecology for management of her heavy periods and has started depo but continues to have daily bleeding and spotting.   August 2024- Hmg 9.0. Microcytic. Ferritin- 3, Iron sat 3%, TIBC 536. LDH- 241. Negative for thalassemia or hemoglobin variant. TSH, B12, Folate were normal. Schistocytes on blood smear.    Review of Systems  Constitutional:  Positive for fatigue. Negative for appetite change and unexpected weight change.  HENT:   Negative for mouth sores, sore throat and trouble swallowing.   Respiratory:  Positive for shortness of breath. Negative for chest tightness and hemoptysis.   Cardiovascular:  Negative for chest pain and leg swelling.  Gastrointestinal:  Negative for abdominal pain, blood in stool, constipation, diarrhea, nausea, rectal pain and vomiting.  Genitourinary:  Positive for vaginal bleeding. Negative for bladder incontinence, dysuria, hematuria and pelvic pain.   Musculoskeletal:  Negative for flank pain and neck stiffness.  Skin:  Negative for itching, rash and wound.  Neurological:  Positive for light-headedness. Negative for dizziness, headaches and numbness.  Hematological:  Negative for adenopathy. Does not bruise/bleed easily.  Psychiatric/Behavioral:  Negative for confusion, depression and sleep disturbance. The patient is not nervous/anxious.      Past Medical History:  Diagnosis Date  . Iron deficiency anemia    History reviewed. No pertinent surgical history.  Family History  Problem Relation Age of Onset  . Healthy Mother   . Healthy Father   . Sickle cell anemia Brother   . Clotting disorder Maternal Grandfather     reports that she has never smoked. She has been exposed to tobacco smoke. She has never used smokeless tobacco. She reports that she does not drink alcohol and does not use drugs. She is currently in school at [employer] and plans to transfer to [employer] next year. 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   No Known Allergies  Current Outpatient Medications  Medication Sig Dispense Refill  . medroxyPROGESTERone (DEPO-PROVERA) 150 MG/ML injection Inject 150  mg into the muscle every 3 (three) months.    . ferrous sulfate 325 (65 FE) MG tablet Take 325 mg by mouth daily with breakfast. (Patient not taking: Reported on 09/25/2023)     . VITAMIN D, CHOLECALCIFEROL, PO Take by mouth. One tablet daily, 1250(dose) (Patient not taking: Reported on 09/25/2023)     No current facility-administered medications for this visit.   Objective:  Blood pressure 111/76, pulse 73, temperature 98.6 F (37 C), resp. rate 20, weight 122 lb 3.2 oz (55.4 kg), SpO2 100%.  Wt Readings from Last 3 Encounters:  09/25/23 122 lb 3.2 oz (55.4 kg) (42%, Z= -0.21)*  07/25/23 123 lb 6.4 oz (56 kg) (45%, Z= -0.13)*  07/23/23 122 lb 3.2 oz (55.4 kg) (42%, Z= -0.19)*   * Growth percentiles are based on CDC (Girls, 2-20 Years) data.    Body mass index is 19.14 kg/m.  Performance status (ECOG): 1 - Symptomatic but completely ambulatory Physical Exam Vitals reviewed.  Constitutional:      Appearance: She is not ill-appearing.  Cardiovascular:     Rate and Rhythm: Normal rate and regular rhythm.  Pulmonary:     Effort: No respiratory distress.  Abdominal:     General: There is no distension.     Palpations: Abdomen is soft.     Tenderness: There is no abdominal tenderness. There is no guarding.  Neurological:     Mental Status: She is alert and oriented to person, place, and time.  Psychiatric:        Mood and Affect: Mood normal.        Behavior: Behavior normal.      Latest Ref Rng & Units 09/25/2023    2:44 PM 08/13/2023    3:33 PM 07/07/2023   10:14 AM  CBC  WBC 4.0 - 10.5 K/uL 3.2  3.7  3.1   Hemoglobin 12.0 - 15.0 g/dL 95.6  38.7  9.0   Hematocrit 36.0 - 46.0 % 35.5  35.8  29.8   Platelets 150 - 400 K/uL 257  254  273       Latest Ref Rng & Units 09/25/2023    2:44 PM 08/13/2023    3:33 PM 07/07/2023   10:14 AM  CMP  Glucose 70 - 99 mg/dL 564  90  90   BUN 6 - 20 mg/dL 13  9  11    Creatinine 0.44 - 1.00 mg/dL 3.32  9.51  8.84   Sodium 135 - 145 mmol/L 137  138  136   Potassium 3.5 - 5.1 mmol/L 3.6  4.0  4.5   Chloride 98 - 111 mmol/L 107  107  106   CO2 22 - 32 mmol/L 23  25  24    Calcium 8.9 - 10.3 mg/dL 9.4  9.7  9.5    Total Protein 6.5 - 8.1 g/dL 7.8  7.8  7.8   Total Bilirubin 0.3 - 1.2 mg/dL 2.7  1.9  1.7   Alkaline Phos 38 - 126 U/L 37  39  34   AST 15 - 41 U/L 18  21  20    ALT 0 - 44 U/L 12  11  10     Lab Results  Component Value Date   TIBC 406 08/13/2023   TIBC 536 (H) 07/07/2023   FERRITIN 63 08/13/2023   FERRITIN 3 (L) 07/07/2023   IRONPCTSAT 22 08/13/2023   IRONPCTSAT 3 (L) 07/07/2023   Lab Results  Component Value Date   LDH 149 08/13/2023  LDH 241 (H) 07/07/2023   No results found.   Assessment & Plan:  WAVELINE MCTEAGUE is a 17 y.o. female who presents in consultation of   # Anemia- workup consistent with iron deficiency, likely secondary to menorrhagia, though cannot rule out some hemolysis from underlying hemoglobinopathy. She was negative for thalassemia or hemoglobin variant and alpha thalassemia results are pending. She received venofer 200 mg x 5. Toelrated well. Hemoglobin has improved to 11.9. Microcytosis improving. Ferritin now 63, iron sat 22%. Symptomatically improved. LDH has normalized. More likely that ldh and schistocytes were related to IDA given that they've normalized with improvement in her counts. Given ongoing bleeding, she will likely need continued support of her counts with IV iron. Plan to recheck counts in 1 month, see me, +/- venofer.   # Menorrhagia- Ongoing vaginal bleeding despite depo administration. She has follow up with OB later this week. Recommend evaluation and possible alternatives to help control her bleeding.   Disposition:  Labs today Venofer 200 mg IV x 5  1 month lab (cbc, cmp, ldh, ferritin, iron studies, bilirubin studies) Virtual follow up with me a few days later- la  Patient would like to transition care to [facility]  I discussed the assessment and treatment plan with the patient.  The patient was provided an opportunity to ask questions and all were answered.  The patient agreed with the plan and demonstrated an  understanding of the instructions.  The patient was advised to call back if the symptoms worsen or if the condition fails to improve as anticipated.  Consuello Masse, DNP, AGNP-C [facility] A Department of the [place]. [facility] [phone] (clinic)  Dr. Normand Sloop, please see my above note from today's visit. Thank you for the opportunity to participate in the care of your very pleasant patient. If you have any questions or concerns, please feel free to reach out.   CC: Dr. Normand Sloop   Addendum: spoke to patient's mother regarding results and plan. Will plan for venofer x 5 at [facility] center. Orders updated. Patient then wishes to transfer her care to [facility]. Schedulers will contact her for an appointment.

## 2023-09-26 ENCOUNTER — Encounter: Payer: Self-pay | Admitting: Nurse Practitioner

## 2023-09-26 LAB — HAPTOGLOBIN: Haptoglobin: <10 mg/dL — ABNORMAL LOW (ref 33–278)

## 2023-10-03 ENCOUNTER — Ambulatory Visit: Payer: BC Managed Care – PPO | Admitting: Nurse Practitioner

## 2023-10-03 ENCOUNTER — Other Ambulatory Visit: Payer: BC Managed Care – PPO

## 2023-10-03 ENCOUNTER — Ambulatory Visit: Payer: BC Managed Care – PPO

## 2023-10-05 ENCOUNTER — Ambulatory Visit
Admission: RE | Admit: 2023-10-05 | Discharge: 2023-10-05 | Disposition: A | Discharge status: Home / Self Care | Payer: BC Managed Care – PPO | Source: Ambulatory Visit | Attending: Nurse Practitioner | Admitting: Nurse Practitioner

## 2023-10-05 DIAGNOSIS — D500 Iron deficiency anemia secondary to blood loss (chronic): Secondary | ICD-10-CM | POA: Diagnosis present

## 2023-10-05 DIAGNOSIS — R778 Other specified abnormalities of plasma proteins: Secondary | ICD-10-CM | POA: Diagnosis present

## 2023-10-05 DIAGNOSIS — R17 Unspecified jaundice: Secondary | ICD-10-CM | POA: Diagnosis present

## 2023-10-28 ENCOUNTER — Encounter: Payer: Self-pay | Admitting: Nurse Practitioner

## 2023-10-29 ENCOUNTER — Inpatient Hospital Stay: Payer: BC Managed Care – PPO | Attending: Nurse Practitioner

## 2023-10-30 ENCOUNTER — Inpatient Hospital Stay: Payer: BC Managed Care – PPO

## 2023-10-30 ENCOUNTER — Inpatient Hospital Stay: Payer: BC Managed Care – PPO | Admitting: Oncology

## 2023-11-29 ENCOUNTER — Encounter: Payer: Self-pay | Admitting: Nurse Practitioner

## 2024-07-30 ENCOUNTER — Telehealth: Payer: Self-pay | Admitting: Oncology

## 2024-07-30 NOTE — Telephone Encounter (Signed)
 Pt calling to schedule an infusion appt. When can I schedule the pt? Looks like her last appt in December was cancelled please advise.

## 2024-08-15 ENCOUNTER — Inpatient Hospital Stay: Attending: Oncology

## 2024-08-15 ENCOUNTER — Encounter: Payer: Self-pay | Admitting: Nurse Practitioner

## 2024-08-15 ENCOUNTER — Inpatient Hospital Stay (HOSPITAL_BASED_OUTPATIENT_CLINIC_OR_DEPARTMENT_OTHER): Admitting: Nurse Practitioner

## 2024-08-15 VITALS — BP 110/74 | HR 76 | Temp 98.6°F | Resp 20 | Wt 129.5 lb

## 2024-08-15 DIAGNOSIS — D509 Iron deficiency anemia, unspecified: Secondary | ICD-10-CM | POA: Diagnosis present

## 2024-08-15 DIAGNOSIS — R778 Other specified abnormalities of plasma proteins: Secondary | ICD-10-CM

## 2024-08-15 DIAGNOSIS — R17 Unspecified jaundice: Secondary | ICD-10-CM

## 2024-08-15 DIAGNOSIS — D500 Iron deficiency anemia secondary to blood loss (chronic): Secondary | ICD-10-CM

## 2024-08-15 LAB — COMPREHENSIVE METABOLIC PANEL WITH GFR
ALT: 12 U/L (ref 0–44)
AST: 21 U/L (ref 15–41)
Albumin: 4.2 g/dL (ref 3.5–5.0)
Alkaline Phosphatase: 46 U/L (ref 38–126)
Anion gap: 6 (ref 5–15)
BUN: 11 mg/dL (ref 6–20)
CO2: 24 mmol/L (ref 22–32)
Calcium: 9 mg/dL (ref 8.9–10.3)
Chloride: 104 mmol/L (ref 98–111)
Creatinine, Ser: 0.62 mg/dL (ref 0.44–1.00)
GFR, Estimated: >60 mL/min (ref >60)
Glucose, Bld: 80 mg/dL (ref 70–99)
Potassium: 3.9 mmol/L (ref 3.5–5.1)
Sodium: 134 mmol/L — ABNORMAL LOW (ref 135–145)
Total Bilirubin: 1.6 mg/dL — ABNORMAL HIGH (ref 0.0–1.2)
Total Protein: 8 g/dL (ref 6.5–8.1)

## 2024-08-15 LAB — CBC WITH DIFFERENTIAL/PLATELET
Abs Immature Granulocytes: 0.02 K/uL (ref 0.00–0.07)
Basophils Absolute: 0 K/uL (ref 0.0–0.1)
Basophils Relative: 0 %
Eosinophils Absolute: 0 K/uL (ref 0.0–0.5)
Eosinophils Relative: 1 %
HCT: 30.1 % — ABNORMAL LOW (ref 36.0–46.0)
Hemoglobin: 9.7 g/dL — ABNORMAL LOW (ref 12.0–15.0)
Immature Granulocytes: 1 %
Lymphocytes Relative: 32 %
Lymphs Abs: 1.1 K/uL (ref 0.7–4.0)
MCH: 24.4 pg — ABNORMAL LOW (ref 26.0–34.0)
MCHC: 32.2 g/dL (ref 30.0–36.0)
MCV: 75.6 fL — ABNORMAL LOW (ref 80.0–100.0)
Monocytes Absolute: 0.3 K/uL (ref 0.1–1.0)
Monocytes Relative: 10 %
Neutro Abs: 1.9 K/uL (ref 1.7–7.7)
Neutrophils Relative %: 56 %
Platelets: 332 K/uL (ref 150–400)
RBC: 3.98 MIL/uL (ref 3.87–5.11)
RDW: 13.2 % (ref 11.5–15.5)
WBC: 3.4 K/uL — ABNORMAL LOW (ref 4.0–10.5)
nRBC: 0 % (ref 0.0–0.2)

## 2024-08-15 LAB — RETIC PANEL
Immature Retic Fract: 22.2 % — ABNORMAL HIGH (ref 2.3–15.9)
RBC.: 3.95 MIL/uL (ref 3.87–5.11)
Retic Count, Absolute: 88.5 K/uL (ref 19.0–186.0)
Retic Ct Pct: 2.2 % (ref 0.4–3.1)
Reticulocyte Hemoglobin: 23.3 pg — ABNORMAL LOW (ref >27.9)

## 2024-08-15 LAB — LACTATE DEHYDROGENASE: LDH: 107 U/L (ref 98–192)

## 2024-08-15 LAB — FERRITIN: Ferritin: 3 ng/mL — ABNORMAL LOW (ref 11–307)

## 2024-08-15 NOTE — Progress Notes (Signed)
 [facility] A Department of the [facility] [address] [place], [place] [address] [phone]) [phone] (fax)  Clinic Day:  08/15/2024  Referring physician: Armond Cape, MD, [facility] Ob[facility]Gyn  Chief Complaints: Anemia  History of Presenting Illness: Kirsten Clayton 18 y.o. female is here because of anemia. She has had history of anemia since approximately age 8. I reviewed labs available and first note anemia in April 2022 with hemoglobin 9.1 with associated microcytosis. She says it has been persistent since onset. She reports fatigue, shortness of breath with exertion and syncopal and pre-syncopal events due to her anemia. She was previously evaluated by [facility] (09/2022) for anemia (hemoglobin 6.3), Ferritin 4, Iron  sat 4%, TIBC 543. She received blood transfusion. Workup including DIC, UA, TSH, Retic, LDH, Haptoglobin, B12, PT/INR, MMA, Folate were either normal or unremarkable. CRP and ESR were elevated.    Most recently, she experienced 2 syncopal episodes in 2 days after outdoor band practice. She's been increasing her fluid and food intake but symptoms persist. Started menses around age 85 which is approximately when she was first told she was anemic. She has been taking oral iron  but continues to feel poorly. She complains of chest pressure on exertion, shortness of breath with exertion, pre-syncopal and syncopal episodes, pica (ice), leg cramps, brain fog and generalized fatigue. She denies NSAID ingestion and is not on antiplatelet medications. She denies black or bloody stools. She is seeing gynecology for management of her heavy periods and has started depo but continues to have daily bleeding and spotting.   August 2024- Hmg 9.0. Microcytic. Ferritin- 3, Iron  sat 3%, TIBC 536. LDH- 241. Negative for thalassemia or hemoglobin variant. TSH, B12, Folate were normal. Schistocytes on blood smear.    Interval History: Patient returns to clinic for follow up. Last seen October 2024 then lost to follow up. She continues to have vaginal bleeding that is heavy. Her fatigue and malaise has worsened again. Complains of craving ice and leg cramping. She denies black or bloody stools.    Review of Systems  Constitutional:  Positive for fatigue. Negative for appetite change and unexpected weight change.  HENT:   Negative for mouth sores, sore throat and trouble swallowing.   Respiratory:  Negative for chest tightness, hemoptysis and shortness of breath.   Cardiovascular:  Negative for chest pain and leg swelling.  Gastrointestinal:  Negative for abdominal pain, blood in stool, constipation, diarrhea, nausea, rectal pain and vomiting.  Genitourinary:  Positive for vaginal bleeding. Negative for bladder incontinence, dysuria, hematuria and pelvic pain.   Musculoskeletal:  Positive for myalgias. Negative for flank pain and neck stiffness.  Skin:  Negative for itching, rash and wound.  Neurological:  Negative for dizziness, extremity weakness, headaches, light-headedness and numbness.  Hematological:  Negative for adenopathy. Does not bruise/bleed easily.  Psychiatric/Behavioral:  Negative for confusion, depression and sleep disturbance. The patient is not nervous/anxious.      Past Medical History:  Diagnosis Date   Iron  deficiency anemia    History reviewed. No pertinent surgical history.  Family History  Problem Relation Age of Onset   Healthy Mother    Healthy Father    Sickle cell anemia Brother    Clotting disorder Maternal Grandfather     reports that she has never smoked. She has been exposed to tobacco smoke. She has never used smokeless tobacco. She reports that she does not drink alcohol and does not use  drugs. She is currently in school at [employer] and plans to transfer to [employer] next year. 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   No Known  Allergies  Current Outpatient Medications  Medication Sig Dispense Refill   ferrous sulfate 325 (65 FE) MG tablet Take 325 mg by mouth daily with breakfast. (Patient not taking: Reported on 08/15/2024)     medroxyPROGESTERone (DEPO-PROVERA) 150 MG/ML injection Inject 150 mg into the muscle every 3 (three) months. (Patient not taking: Reported on 08/15/2024)     VITAMIN D, CHOLECALCIFEROL, PO Take by mouth. One tablet daily, 1250(dose) (Patient not taking: Reported on 08/15/2024)     No current facility-administered medications for this visit.   Objective:  Blood pressure 110/74, pulse 76, temperature 98.6 F (37 C), resp. rate 20, weight 129 lb 8 oz (58.7 kg), SpO2 100%.  Wt Readings from Last 3 Encounters:  08/15/24 129 lb 8 oz (58.7 kg) (52%, Z= 0.06)*  09/25/23 122 lb 3.2 oz (55.4 kg) (42%, Z= -0.21)*  07/25/23 123 lb 6.4 oz (56 kg) (45%, Z= -0.13)*   * Growth percentiles are based on CDC (Girls, 2-20 Years) data.    Body mass index is 20.28 kg/m.  Performance status (ECOG): 1 - Symptomatic but completely ambulatory  Physical Exam Vitals reviewed.  Constitutional:      Appearance: She is not ill-appearing.     Comments: Thin build. Fatigued appearing. Unaccompanied  Cardiovascular:     Rate and Rhythm: Normal rate and regular rhythm.  Pulmonary:     Effort: No respiratory distress.  Abdominal:     General: There is no distension.     Palpations: Abdomen is soft.     Tenderness: There is no abdominal tenderness. There is no guarding.  Skin:    Coloration: Skin is not pale.  Neurological:     Mental Status: She is alert and oriented to person, place, and time.  Psychiatric:        Mood and Affect: Mood normal.        Behavior: Behavior normal.       Latest Ref Rng & Units 08/15/2024    2:30 PM 09/25/2023    2:44 PM 08/13/2023    3:33 PM  CBC  WBC 4.0 - 10.5 K/uL 3.4  3.2  3.7   Hemoglobin 12.0 - 15.0 g/dL 9.7  87.4  88.0   Hematocrit 36.0 - 46.0 % 30.1  35.5  35.8    Platelets 150 - 400 K/uL 332  257  254       Latest Ref Rng & Units 08/15/2024    2:30 PM 09/25/2023    4:06 PM 09/25/2023    2:44 PM  CMP  Glucose 70 - 99 mg/dL 80   891   BUN 6 - 20 mg/dL 11   13   Creatinine 9.55 - 1.00 mg/dL 9.37   9.24   Sodium 864 - 145 mmol/L 134   137   Potassium 3.5 - 5.1 mmol/L 3.9   3.6   Chloride 98 - 111 mmol/L 104   107   CO2 22 - 32 mmol/L 24   23   Calcium 8.9 - 10.3 mg/dL 9.0   9.4   Total Protein 6.5 - 8.1 g/dL 8.0   7.8   Total Bilirubin 0.0 - 1.2 mg/dL 1.6  3.3  2.7   Alkaline Phos 38 - 126 U/L 46   37   AST 15 - 41 U/L 21  18   ALT 0 - 44 U/L 12   12    Lab Results  Component Value Date   TIBC 363 09/25/2023   TIBC 406 08/13/2023   TIBC 536 (H) 07/07/2023   FERRITIN 42 09/25/2023   FERRITIN 63 08/13/2023   FERRITIN 3 (L) 07/07/2023   IRONPCTSAT 40 (H) 09/25/2023   IRONPCTSAT 22 08/13/2023   IRONPCTSAT 3 (L) 07/07/2023   Lab Results  Component Value Date   LDH 107 08/15/2024   LDH 150 09/25/2023   LDH 149 08/13/2023   Component Ref Range & Units 1 d ago 2 mo ago  Preg Test, Ur NEGATIVE NEGATIVE NEGATIVE CM           Component Ref Range & Units 1 d ago (09/25/23) 1 d ago (09/25/23) 1 mo ago (08/13/23) 2 mo ago (07/07/23) 1 yr ago (02/01/22) 5 yr ago (10/04/17)  Total Bilirubin 0.3 - 1.2 mg/dL 3.3 High  2.7 High  1.9 High  1.7 High  1.4 High  2.5 High   Bilirubin, Direct 0.0 - 0.2 mg/dL 0.4 High        Indirect Bilirubin 0.3 - 0.9 mg/dL 2.9 High         Component Ref Range & Units 1 d ago 2 mo ago  Haptoglobin 33 - 278 mg/dL <89 Low  21 Low     DAT, complement NEG  DAT, IgG NEG   Technologist Review Smear   Component 1 d ago  WBC MORPHOLOGY MORPHOLOGY UNREMARKABLE  RBC MORPHOLOGY Acanthocytes present  Plt Morphology PLATELETS APPEAR ADEQUATE  Comment: Normal platelet morphology     No results found.   Assessment & Plan:  Kirsten Clayton is a 18 y.o. female who returns to clinic for follow up of:   #  Anemia-  - 2023- hemoglobin 7.2. 06/2023- hemoglobin 9.0, ferritin 3, iron  sat 3%. Workup consistent with iron  deficiency, thought to be secondary to menorrhagia, however, some hemolysis (see below).  - She received venofer  200 mg x 6 07/12/23-09/25/23. Tolerated well. Hmg improved to 12.5, ferritin 42, iron  sat 40%.  - Today, 08/15/24- hmg 9.7 (down), ferritin 3. Symptomatic. Recommend venofer  x 5.  - previously received benadryl  25 mg PO as premedication. No history of reaction. Can hold.   # Microcytosis - Persistent despite resolution of iron  deficiency but improved.  - heavy metals testing was negative.  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 Individuals with alpha-+-thalassemia trait typically have no clinical findings. Additionally, this individual is negative for the Hemoglobin [place] mutation. The presence of other non-deletional mutations, like point mutations, in the alpha-globin gene cannot be excluded as the assay does not detect these changes. Co-inheritance of alpha-thalassemia and other hemoglobinopathies, such as beta-thalassemia and sickle cell disease is possible. Genetic counseling and hemoglobinopathy testing are recommended for this individual's reproductive partner and family members.   # Hemolysis-  - previously thought to be related to her ongoing iron  deficiency however, persistent evidence of hemolysis despite resolution of IDA in September 2024.  - negative for transfusions, pregnancy, infections, or suspicious medications.  No history of artificial heart valves.  - DAT was negative.  Haptoglobin low consistent with hemolysis.  Bilirubin elevated to 3.3 in October 2024.  Fractionated bilirubin revealed elevated indirect bilirubin likely related to hemolysis versus liver  disease?  AST and ALT  were normal.  Question possible Gilbert's?  I reviewed her workup with Dr. Georgina again including smear which showed acanthocytes which were new and suggestive of alteration of membrane of the red blood cell-likely confirming hemolysis. - Today, haptoglobin is normal at 41.  Bilirubin 1.6 (mildly elevated).  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  However, given the improvement in her haptoglobin, I am suspicious that this hemolytic process may be resolving or less active now.  Cold agglutinin titer was negative.  Negative lead and heavy metal screen. G6PD was normal. Ultrasound abdomen and spleen was normal. - Unable to draw copper and PNH today. Plan to check at next visit.  - Given her family history I remain suspicious of an inherited etiology.  I discussed this with genetic counseling who advises that if additional workup is unremarkable, could consider [facility] hereditary hemolytic anemia panel.   # Menorrhagia-  - Ongoing vaginal bleeding despite depo administration. I had previously reached out to her OB but she has not been seen. I recommend she contact them for follow up as her continued blood loss is likely contributing to her anemia.   Disposition:  Additional labs today Venofer  200 mg IV today 3 mo- labs (cbc, cmp, ldh, haptoglobin, ferritin, iron  studies, PNH, copper), Dr Jacobo, +/- venofer - la  I discussed the assessment and treatment plan with the patient.  The patient was provided an opportunity to ask questions and all were answered.  The patient agreed with the plan and demonstrated an understanding of the instructions.  The patient was advised to call back if the symptoms worsen or if the condition fails to improve as anticipated.  Tinnie Dawn, DNP, AGNP-C [facility] A Department of the [place]. [facility] [phone]  (clinic)  CC: Dr. Armond, Dr Jacobo

## 2024-08-17 LAB — HAPTOGLOBIN: Haptoglobin: 41 mg/dL (ref 33–278)

## 2024-08-18 ENCOUNTER — Other Ambulatory Visit: Payer: Self-pay | Admitting: Nurse Practitioner

## 2024-08-18 LAB — G-6-PD, QUANT, BLOOD AND RBC
G-6-PD, Quant: 282 U/10*12{RBCs} (ref 155–399)
RBC: 4.08 x10E6/uL (ref 3.77–5.28)

## 2024-08-18 LAB — COLD AGGLUTININ TITER: Cold Agglutinin Titer: NEGATIVE

## 2024-08-18 NOTE — Progress Notes (Signed)
 Benadryl  removed as pre-med. Patient does not have history of intolerance or allergic reaction.

## 2024-08-20 ENCOUNTER — Inpatient Hospital Stay

## 2024-08-20 VITALS — BP 106/68 | HR 72 | Temp 97.1°F

## 2024-08-20 DIAGNOSIS — D500 Iron deficiency anemia secondary to blood loss (chronic): Secondary | ICD-10-CM

## 2024-08-20 DIAGNOSIS — D509 Iron deficiency anemia, unspecified: Secondary | ICD-10-CM | POA: Diagnosis not present

## 2024-08-20 MED ORDER — IRON SUCROSE 20 MG/ML IV SOLN
200.0000 mg | Freq: Once | INTRAVENOUS | Status: AC
  Administered 2024-08-20: 200 mg via INTRAVENOUS
  Filled 2024-08-20: qty 10

## 2024-08-22 ENCOUNTER — Inpatient Hospital Stay

## 2024-08-22 VITALS — BP 97/65 | HR 89 | Temp 96.1°F | Resp 18

## 2024-08-22 DIAGNOSIS — D500 Iron deficiency anemia secondary to blood loss (chronic): Secondary | ICD-10-CM

## 2024-08-22 DIAGNOSIS — D509 Iron deficiency anemia, unspecified: Secondary | ICD-10-CM | POA: Diagnosis not present

## 2024-08-22 LAB — HEMOGLOBIN FREE, PLASMA: Hgb, Plasma: 376 mg/dL — ABNORMAL HIGH (ref 0.0–4.9)

## 2024-08-22 LAB — HEAVY METALS, BLOOD
Arsenic: 2 ug/L (ref 0–9)
Lead: <1 ug/dL (ref 0.0–3.4)
Mercury: <1 ug/L (ref 0.0–14.9)

## 2024-08-22 MED ORDER — DIPHENHYDRAMINE HCL 25 MG PO CAPS
25.0000 mg | ORAL_CAPSULE | Freq: Once | ORAL | Status: AC
Start: 2024-08-22 — End: 2024-08-22
  Administered 2024-08-22: 25 mg via ORAL
  Filled 2024-08-22: qty 1

## 2024-08-22 MED ORDER — IRON SUCROSE 20 MG/ML IV SOLN
200.0000 mg | Freq: Once | INTRAVENOUS | Status: AC
  Administered 2024-08-22: 200 mg via INTRAVENOUS
  Filled 2024-08-22: qty 10

## 2024-08-27 ENCOUNTER — Inpatient Hospital Stay

## 2024-08-29 ENCOUNTER — Inpatient Hospital Stay: Attending: Oncology

## 2024-08-29 VITALS — BP 99/61 | HR 63 | Temp 97.8°F

## 2024-08-29 DIAGNOSIS — D500 Iron deficiency anemia secondary to blood loss (chronic): Secondary | ICD-10-CM

## 2024-08-29 DIAGNOSIS — D509 Iron deficiency anemia, unspecified: Secondary | ICD-10-CM | POA: Diagnosis present

## 2024-08-29 MED ORDER — IRON SUCROSE 20 MG/ML IV SOLN
200.0000 mg | Freq: Once | INTRAVENOUS | Status: AC
  Administered 2024-08-29: 200 mg via INTRAVENOUS
  Filled 2024-08-29: qty 10

## 2024-08-29 MED ORDER — DIPHENHYDRAMINE HCL 25 MG PO CAPS
25.0000 mg | ORAL_CAPSULE | Freq: Once | ORAL | Status: AC
Start: 2024-08-29 — End: 2024-08-29
  Administered 2024-08-29: 25 mg via ORAL
  Filled 2024-08-29: qty 1

## 2024-09-03 ENCOUNTER — Inpatient Hospital Stay

## 2024-09-03 VITALS — BP 102/69 | HR 60 | Temp 96.9°F | Resp 16

## 2024-09-03 DIAGNOSIS — D500 Iron deficiency anemia secondary to blood loss (chronic): Secondary | ICD-10-CM

## 2024-09-03 DIAGNOSIS — D509 Iron deficiency anemia, unspecified: Secondary | ICD-10-CM | POA: Diagnosis not present

## 2024-09-03 MED ORDER — IRON SUCROSE 20 MG/ML IV SOLN
200.0000 mg | Freq: Once | INTRAVENOUS | Status: AC
  Administered 2024-09-03: 200 mg via INTRAVENOUS
  Filled 2024-09-03: qty 10

## 2024-09-03 MED ORDER — DIPHENHYDRAMINE HCL 25 MG PO CAPS
25.0000 mg | ORAL_CAPSULE | Freq: Once | ORAL | Status: DC
  Filled 2024-09-03: qty 1

## 2024-09-03 NOTE — Progress Notes (Signed)
 Patient declined to wait the 30 minutes for post iron infusion observation today. Tolerated infusion well. VSS.

## 2024-09-05 ENCOUNTER — Inpatient Hospital Stay

## 2024-09-05 VITALS — BP 99/68 | HR 66 | Temp 97.9°F | Resp 16

## 2024-09-05 DIAGNOSIS — D509 Iron deficiency anemia, unspecified: Secondary | ICD-10-CM | POA: Diagnosis not present

## 2024-09-05 DIAGNOSIS — D500 Iron deficiency anemia secondary to blood loss (chronic): Secondary | ICD-10-CM

## 2024-09-05 MED ORDER — IRON SUCROSE 20 MG/ML IV SOLN
200.0000 mg | Freq: Once | INTRAVENOUS | Status: AC
  Administered 2024-09-05: 200 mg via INTRAVENOUS
  Filled 2024-09-05: qty 10

## 2024-09-05 MED ORDER — DIPHENHYDRAMINE HCL 25 MG PO CAPS: 25.0000 mg | ORAL_CAPSULE | Freq: Once | ORAL | Status: DC

## 2024-11-03 ENCOUNTER — Encounter: Payer: Self-pay | Admitting: Nurse Practitioner

## 2024-11-12 ENCOUNTER — Inpatient Hospital Stay

## 2024-11-12 ENCOUNTER — Encounter: Payer: Self-pay | Admitting: Oncology

## 2024-11-12 ENCOUNTER — Inpatient Hospital Stay: Attending: Oncology

## 2024-11-12 ENCOUNTER — Inpatient Hospital Stay: Admitting: Oncology

## 2024-11-12 VITALS — BP 116/72 | HR 90 | Temp 99.1°F | Resp 18 | Wt 131.0 lb

## 2024-11-12 DIAGNOSIS — D500 Iron deficiency anemia secondary to blood loss (chronic): Secondary | ICD-10-CM

## 2024-11-12 DIAGNOSIS — R17 Unspecified jaundice: Secondary | ICD-10-CM | POA: Insufficient documentation

## 2024-11-12 DIAGNOSIS — D563 Thalassemia minor: Secondary | ICD-10-CM | POA: Insufficient documentation

## 2024-11-12 DIAGNOSIS — Z862 Personal history of diseases of the blood and blood-forming organs and certain disorders involving the immune mechanism: Secondary | ICD-10-CM | POA: Insufficient documentation

## 2024-11-12 DIAGNOSIS — Z8639 Personal history of other endocrine, nutritional and metabolic disease: Secondary | ICD-10-CM | POA: Diagnosis present

## 2024-11-12 DIAGNOSIS — R778 Other specified abnormalities of plasma proteins: Secondary | ICD-10-CM

## 2024-11-12 LAB — CBC WITH DIFFERENTIAL/PLATELET
Abs Immature Granulocytes: 0.01 K/uL (ref 0.00–0.07)
Basophils Absolute: 0 K/uL (ref 0.0–0.1)
Basophils Relative: 0 %
Eosinophils Absolute: 0 K/uL (ref 0.0–0.5)
Eosinophils Relative: 1 %
HCT: 34.8 % — ABNORMAL LOW (ref 36.0–46.0)
Hemoglobin: 12.7 g/dL (ref 12.0–15.0)
Immature Granulocytes: 0 %
Lymphocytes Relative: 28 %
Lymphs Abs: 1 K/uL (ref 0.7–4.0)
MCH: 28.3 pg (ref 26.0–34.0)
MCHC: 36.5 g/dL — ABNORMAL HIGH (ref 30.0–36.0)
MCV: 77.5 fL — ABNORMAL LOW (ref 80.0–100.0)
Monocytes Absolute: 0.4 K/uL (ref 0.1–1.0)
Monocytes Relative: 11 %
Neutro Abs: 2.1 K/uL (ref 1.7–7.7)
Neutrophils Relative %: 60 %
Platelets: 226 K/uL (ref 150–400)
RBC: 4.49 MIL/uL (ref 3.87–5.11)
RDW: 15.3 % (ref 11.5–15.5)
WBC: 3.6 K/uL — ABNORMAL LOW (ref 4.0–10.5)
nRBC: 0 % (ref 0.0–0.2)

## 2024-11-12 LAB — LACTATE DEHYDROGENASE: LDH: 145 U/L (ref 105–235)

## 2024-11-12 LAB — IRON AND TIBC
Iron: 170 ug/dL (ref 28–170)
Saturation Ratios: 44 % — ABNORMAL HIGH (ref 10.4–31.8)
TIBC: 386 ug/dL (ref 250–450)
UIBC: 216 ug/dL

## 2024-11-12 LAB — COMPREHENSIVE METABOLIC PANEL WITH GFR
ALT: 8 U/L (ref 0–44)
AST: 20 U/L (ref 15–41)
Albumin: 4.6 g/dL (ref 3.5–5.0)
Alkaline Phosphatase: 44 U/L (ref 38–126)
Anion gap: 10 (ref 5–15)
BUN: 9 mg/dL (ref 6–20)
CO2: 23 mmol/L (ref 22–32)
Calcium: 9.7 mg/dL (ref 8.9–10.3)
Chloride: 104 mmol/L (ref 98–111)
Creatinine, Ser: 0.69 mg/dL (ref 0.44–1.00)
GFR, Estimated: >60 mL/min (ref >60)
Glucose, Bld: 85 mg/dL (ref 70–99)
Potassium: 3.8 mmol/L (ref 3.5–5.1)
Sodium: 137 mmol/L (ref 135–145)
Total Bilirubin: 2.6 mg/dL — ABNORMAL HIGH (ref 0.0–1.2)
Total Protein: 7.5 g/dL (ref 6.5–8.1)

## 2024-11-12 LAB — FERRITIN: Ferritin: 59 ng/mL (ref 11–307)

## 2024-11-13 ENCOUNTER — Encounter: Payer: Self-pay | Admitting: Nurse Practitioner

## 2024-11-13 LAB — HAPTOGLOBIN: Haptoglobin: 12 mg/dL — ABNORMAL LOW (ref 33–278)

## 2024-11-13 LAB — COPPER, SERUM: Copper: 77 ug/dL — ABNORMAL LOW (ref 80–158)

## 2024-11-13 NOTE — Progress Notes (Signed)
 [facility]  [phone] [phone]  ID: Kirsten Clayton OB: Mar 27, 2006  MR#: [id number]  RDW#:[id number]  Patient Care Team: Armond Cape, MD as PCP - General (Obstetrics and Gynecology) Dasie Tinnie MATSU, NP as Nurse Practitioner (Nurse Practitioner) Jacobo Evalene PARAS, MD as Consulting Physician (Oncology)  CHIEF COMPLAINT: Iron  deficiency anemia, alpha thalassemia trait.  INTERVAL HISTORY: Patient previously evaluated by nurse practitioner.  She returns to clinic today for repeat laboratory work, further evaluation, and consideration of additional IV Venofer .  She currently feels well and is asymptomatic.  She does not complain of any weakness or fatigue.  She has no neurologic complaints.  She denies any recent fevers or illnesses.  She has a good appetite and denies weight loss.  She has no chest pain, shortness of breath, cough, or hemoptysis.  She denies any nausea, vomiting, constipation, or diarrhea.  She has no melena or hematochezia.  She has no urinary complaints.  Patient offers no specific complaints today.  REVIEW OF SYSTEMS:   Review of Systems  Constitutional: Negative.  Negative for fever, malaise/fatigue and weight loss.  Respiratory: Negative.  Negative for cough, hemoptysis and shortness of breath.   Cardiovascular: Negative.  Negative for chest pain and leg swelling.  Gastrointestinal: Negative.  Negative for abdominal pain, blood in stool and melena.  Genitourinary: Negative.  Negative for dysuria.  Musculoskeletal: Negative.  Negative for back pain.  Skin: Negative.  Negative for rash.  Neurological: Negative.  Negative for dizziness, focal weakness, weakness and headaches.  Psychiatric/Behavioral: Negative.  The patient is not nervous/anxious.     As per HPI. Otherwise, a complete review of systems is negative.  PAST MEDICAL HISTORY: Past Medical History:  Diagnosis Date   Iron  deficiency anemia     PAST SURGICAL  HISTORY: History reviewed. No pertinent surgical history.  FAMILY HISTORY: Family History  Problem Relation Age of Onset   Healthy Mother    Healthy Father    Sickle cell anemia Brother    Clotting disorder Maternal Grandfather     ADVANCED DIRECTIVES (Y/N):  N  HEALTH MAINTENANCE: Social History[1]   Colonoscopy:  PAP:  Bone density:  Lipid panel:  Allergies[2]  Current Outpatient Medications  Medication Sig Dispense Refill   ferrous sulfate 325 (65 FE) MG tablet Take 325 mg by mouth daily with breakfast. (Patient not taking: Reported on 08/15/2024)     medroxyPROGESTERone (DEPO-PROVERA) 150 MG/ML injection Inject 150 mg into the muscle every 3 (three) months. (Patient not taking: Reported on 08/15/2024)     VITAMIN D, CHOLECALCIFEROL, PO Take by mouth. One tablet daily, 1250(dose) (Patient not taking: Reported on 08/15/2024)     No current facility-administered medications for this visit.    OBJECTIVE: Vitals:   11/12/24 1458  BP: 116/72  Pulse: 90  Resp: 18  Temp: 99.1 F (37.3 C)  SpO2: 100%     Body mass index is 20.52 kg/m.    ECOG FS:0 - Asymptomatic  General: Well-developed, well-nourished, no acute distress. Eyes: Pink conjunctiva, anicteric sclera. HEENT: Normocephalic, moist mucous membranes. Lungs: No audible wheezing or coughing. Heart: Regular rate and rhythm. Abdomen: Soft, nontender, no obvious distention. Musculoskeletal: No edema, cyanosis, or clubbing. Neuro: Alert, answering all questions appropriately. Cranial nerves grossly intact. Skin: No rashes or petechiae noted. Psych: Normal affect. Lymphatics: No cervical, calvicular, axillary or inguinal LAD.   LAB RESULTS:  Lab Results  Component Value Date   NA 137 11/12/2024   K 3.8 11/12/2024   CL  104 11/12/2024   CO2 23 11/12/2024   GLUCOSE 85 11/12/2024   BUN 9 11/12/2024   CREATININE 0.69 11/12/2024   CALCIUM 9.7 11/12/2024   PROT 7.5 11/12/2024   ALBUMIN 4.6 11/12/2024   AST 20  11/12/2024   ALT 8 11/12/2024   ALKPHOS 44 11/12/2024   BILITOT 2.6 (H) 11/12/2024   GFRNONAA >60 11/12/2024   GFRAA NOT CALCULATED 10/04/2017    Lab Results  Component Value Date   WBC 3.6 (L) 11/12/2024   NEUTROABS 2.1 11/12/2024   HGB 12.7 11/12/2024   HCT 34.8 (L) 11/12/2024   MCV 77.5 (L) 11/12/2024   PLT 226 11/12/2024   Lab Results  Component Value Date   IRON  170 11/12/2024   TIBC 386 11/12/2024   IRONPCTSAT 44 (H) 11/12/2024   Lab Results  Component Value Date   FERRITIN 59 11/12/2024     STUDIES: No results found.  ASSESSMENT: Iron  deficiency anemia, alpha thalassemia trait.  PLAN:     Iron  deficiency anemia: Resolved.  Patient's hemoglobin and iron  stores are now within normal limits.  She does not require additional IV Venofer .  Patient last received treatment on September 05, 2024.  No intervention is needed at this time.  Return to clinic in 3 months with repeat laboratory, further evaluation, and continuation of treatment if needed. Alpha thalassemia trait: Likely the etiology of patient's persistent microcytosis.  Clinically insignificant. Possible hemolysis: LDH was previously elevated, but now is within normal limits.  Haptoglobin remains persistently decreased at 12.  Her total bilirubin also remains persistently increased ranging between 1.6 and 3.3.  Her result today is 2.6.  Previously, free hemoglobin and plasma was elevated.  DAT, cold agglutinins, heavy metals, and G6PD all were either negative or within normal limits.  PNH profile and copper  levels are pending at time of dictation.  Patient previously was reported to have schistocytes on peripheral smear, but the clinical significance of this is unclear.  Case was previously discussed with genetics who recommended a hereditary hemolytic anemia panel if no distinct etiology can be determined. Elevated bilirubin: Mostly indirect.  Possible Gilbert's disease?  Monitor.   Patient expressed understanding  and was in agreement with this plan. She also understands that She can call clinic at any time with any questions, concerns, or complaints.    Evalene JINNY Reusing, MD   11/13/2024 7:17 AM        [1]  Social History Tobacco Use   Smoking status: Never    Passive exposure: Yes   Smokeless tobacco: Never  Vaping Use   Vaping status: Never Used  Substance Use Topics   Alcohol use: No   Drug use: No  [2] Not on File

## 2024-11-17 LAB — PNH PROFILE (-HIGH SENSITIVITY)

## 2024-11-26 ENCOUNTER — Ambulatory Visit

## 2024-11-26 ENCOUNTER — Ambulatory Visit: Admitting: Oncology

## 2024-11-26 ENCOUNTER — Other Ambulatory Visit

## 2025-02-09 ENCOUNTER — Inpatient Hospital Stay

## 2025-02-10 ENCOUNTER — Inpatient Hospital Stay: Admitting: Oncology

## 2025-02-10 ENCOUNTER — Inpatient Hospital Stay
